# Patient Record
Sex: Female | Born: 2000 | Hispanic: Yes | Marital: Single | Smoking: Never smoker
Health system: Southern US, Community
[De-identification: ages and names within clinical notes are randomized; demographics above are authoritative.]

---
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
## 2020-05-15 ENCOUNTER — Other Ambulatory Visit: Payer: BC Managed Care – PPO

## 2020-05-15 DIAGNOSIS — Z20822 Contact with and (suspected) exposure to covid-19: Secondary | ICD-10-CM

## 2020-05-16 LAB — SARS-COV-2, NAA 2 DAY TAT

## 2020-05-16 LAB — NOVEL CORONAVIRUS, NAA: SARS-CoV-2, NAA: NOT DETECTED

## 2020-05-17 ENCOUNTER — Telehealth: Payer: Self-pay

## 2020-05-17 NOTE — Telephone Encounter (Signed)
Patient called in and received her negative covid test result
